# Patient Record
Sex: Male | Born: 1995 | Race: White | Hispanic: No | Marital: Single
Health system: Southern US, Community
[De-identification: ages and names within clinical notes are randomized; demographics above are authoritative.]

## PROBLEM LIST (undated history)

## (undated) DIAGNOSIS — I10 Essential (primary) hypertension: Secondary | ICD-10-CM

## (undated) HISTORY — DX: Essential (primary) hypertension: I10

## (undated) HISTORY — PX: WISDOM TOOTH EXTRACTION: SHX21

---
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
## 2018-06-06 ENCOUNTER — Telehealth: Payer: Self-pay | Admitting: Family Medicine

## 2018-06-06 NOTE — Telephone Encounter (Signed)
Called because it has been since 10/2013 since last seen by Dr Dayton Martes and wasn't sure if he was still planning on seeing her as PCP, left message

## 2019-07-17 ENCOUNTER — Telehealth: Payer: Self-pay | Admitting: General Practice

## 2019-07-17 NOTE — Telephone Encounter (Signed)
Removed Dr. Aron as patient's PCP due to her leaving this practice and patient has not been seen in 6 years.
